# Patient Record
Sex: Female | Born: 1955
Health system: Southern US, Community
[De-identification: ages and names within clinical notes are randomized; demographics above are authoritative.]

## PROBLEM LIST (undated history)

## (undated) DIAGNOSIS — I10 Essential (primary) hypertension: Secondary | ICD-10-CM

## (undated) HISTORY — DX: Essential (primary) hypertension: I10

---

## 2000-01-01 ENCOUNTER — Encounter: Payer: Self-pay | Admitting: Obstetrics and Gynecology

## 2000-01-01 ENCOUNTER — Ambulatory Visit (HOSPITAL_COMMUNITY): Admission: RE | Admit: 2000-01-01 | Discharge: 2000-01-01 | Payer: Self-pay | Admitting: Obstetrics and Gynecology

## 2000-01-17 ENCOUNTER — Ambulatory Visit (HOSPITAL_COMMUNITY): Admission: RE | Admit: 2000-01-17 | Discharge: 2000-01-17 | Payer: Self-pay | Admitting: General Surgery

## 2000-01-17 ENCOUNTER — Encounter: Payer: Self-pay | Admitting: General Surgery

## 2000-01-19 ENCOUNTER — Encounter: Payer: Self-pay | Admitting: General Surgery

## 2000-01-25 ENCOUNTER — Inpatient Hospital Stay (HOSPITAL_COMMUNITY): Admission: RE | Admit: 2000-01-25 | Discharge: 2000-01-30 | Payer: Self-pay | Admitting: General Surgery

## 2011-05-31 ENCOUNTER — Other Ambulatory Visit: Payer: Self-pay | Admitting: Family Medicine

## 2011-05-31 DIAGNOSIS — Z1231 Encounter for screening mammogram for malignant neoplasm of breast: Secondary | ICD-10-CM

## 2011-06-09 ENCOUNTER — Ambulatory Visit: Payer: Self-pay

## 2011-06-09 ENCOUNTER — Ambulatory Visit
Admission: RE | Admit: 2011-06-09 | Discharge: 2011-06-09 | Disposition: A | Discharge status: Home / Self Care | Payer: BC Managed Care – PPO | Source: Ambulatory Visit | Attending: Family Medicine | Admitting: Family Medicine

## 2011-06-09 DIAGNOSIS — Z1231 Encounter for screening mammogram for malignant neoplasm of breast: Secondary | ICD-10-CM

## 2012-12-03 ENCOUNTER — Other Ambulatory Visit: Payer: Self-pay | Admitting: Physician Assistant

## 2012-12-03 DIAGNOSIS — Z1231 Encounter for screening mammogram for malignant neoplasm of breast: Secondary | ICD-10-CM

## 2012-12-17 ENCOUNTER — Ambulatory Visit: Payer: BC Managed Care – PPO

## 2013-01-04 ENCOUNTER — Ambulatory Visit
Admission: RE | Admit: 2013-01-04 | Discharge: 2013-01-04 | Disposition: A | Discharge status: Home / Self Care | Payer: BC Managed Care – PPO | Source: Ambulatory Visit | Attending: Physician Assistant | Admitting: Physician Assistant

## 2013-01-04 DIAGNOSIS — Z1231 Encounter for screening mammogram for malignant neoplasm of breast: Secondary | ICD-10-CM

## 2017-02-08 ENCOUNTER — Other Ambulatory Visit: Payer: Self-pay | Admitting: Physician Assistant

## 2017-02-08 DIAGNOSIS — Z1231 Encounter for screening mammogram for malignant neoplasm of breast: Secondary | ICD-10-CM

## 2018-02-08 ENCOUNTER — Other Ambulatory Visit: Payer: Self-pay | Admitting: Physician Assistant

## 2018-02-08 DIAGNOSIS — Z1231 Encounter for screening mammogram for malignant neoplasm of breast: Secondary | ICD-10-CM

## 2018-02-19 ENCOUNTER — Ambulatory Visit
Admission: RE | Admit: 2018-02-19 | Discharge: 2018-02-19 | Disposition: A | Discharge status: Home / Self Care | Payer: 59 | Source: Ambulatory Visit | Attending: Physician Assistant | Admitting: Physician Assistant

## 2018-02-19 DIAGNOSIS — Z1231 Encounter for screening mammogram for malignant neoplasm of breast: Secondary | ICD-10-CM

## 2019-05-30 ENCOUNTER — Other Ambulatory Visit: Payer: Self-pay | Admitting: Physician Assistant

## 2019-05-30 DIAGNOSIS — Z1231 Encounter for screening mammogram for malignant neoplasm of breast: Secondary | ICD-10-CM

## 2019-06-24 ENCOUNTER — Ambulatory Visit (AMBULATORY_SURGERY_CENTER): Payer: Self-pay | Admitting: *Deleted

## 2019-06-24 ENCOUNTER — Other Ambulatory Visit: Payer: Self-pay

## 2019-06-24 VITALS — Temp 97.1°F | Ht 65.75 in | Wt 183.0 lb

## 2019-06-24 DIAGNOSIS — Z01818 Encounter for other preprocedural examination: Secondary | ICD-10-CM

## 2019-06-24 DIAGNOSIS — Z1211 Encounter for screening for malignant neoplasm of colon: Secondary | ICD-10-CM

## 2019-06-24 MED ORDER — NA SULFATE-K SULFATE-MG SULF 17.5-3.13-1.6 GM/177ML PO SOLN: ORAL | 0 refills | Status: DC

## 2019-06-24 NOTE — Progress Notes (Signed)
Patient is here in-person for PV. Patient denies any allergies to eggs or soy. Patient denies any problems with anesthesia/sedation. Patient denies any oxygen use at home. Patient denies taking any  blood thinners.patient is taking Phentermine and she is aware to stop this 10 days prior to exam.  Patient is not being treated for MRSA or C-diff. EMMI education assisgned to the patient for the procedure, this was explained and instructions given to patient. COVID-19 screening test is on 3/10, the pt is aware. Pt is aware that care partner will wait in the car during procedure; if they feel like they will be too hot or cold to wait in the car; they may wait in the 4 th floor lobby. Patient is aware to bring only one care partner. We want them to wear a mask (we do not have any that we can provide them), practice social distancing, and we will check their temperatures when they get here.  I did remind the patient that their care partner needs to stay in the parking lot the entire time and have a cell phone available, we will call them when the pt is ready for discharge. Patient will wear mask into building.    Suprep $15 off coupon given to the patient.

## 2019-07-03 ENCOUNTER — Ambulatory Visit (INDEPENDENT_AMBULATORY_CARE_PROVIDER_SITE_OTHER): Payer: 59

## 2019-07-03 ENCOUNTER — Other Ambulatory Visit: Payer: Self-pay | Admitting: Gastroenterology

## 2019-07-03 ENCOUNTER — Encounter: Payer: Self-pay | Admitting: Gastroenterology

## 2019-07-03 DIAGNOSIS — Z1159 Encounter for screening for other viral diseases: Secondary | ICD-10-CM

## 2019-07-04 LAB — SARS CORONAVIRUS 2 (TAT 6-24 HRS): SARS Coronavirus 2: NEGATIVE

## 2019-07-08 ENCOUNTER — Ambulatory Visit (AMBULATORY_SURGERY_CENTER): Payer: 59 | Admitting: Gastroenterology

## 2019-07-08 ENCOUNTER — Other Ambulatory Visit: Payer: Self-pay

## 2019-07-08 ENCOUNTER — Encounter: Payer: Self-pay | Admitting: Gastroenterology

## 2019-07-08 VITALS — BP 119/72 | HR 63 | Temp 97.5°F | Resp 16 | Ht 65.75 in | Wt 183.0 lb

## 2019-07-08 DIAGNOSIS — Z1211 Encounter for screening for malignant neoplasm of colon: Secondary | ICD-10-CM

## 2019-07-08 MED ORDER — SODIUM CHLORIDE 0.9 % IV SOLN: 500.0000 mL | Freq: Once | INTRAVENOUS | Status: DC

## 2019-07-08 NOTE — Op Note (Signed)
[facility] Patient Name: Sandy Blackburn Procedure Date: 07/08/2019 8:40 AM MRN: [id number] Endoscopist: Sherilyn Cooter L. Myrtie Neither , MD Age: 64 Referring MD:  Date of Birth: 03/19/1956 Gender: Female Account #: [id number] Procedure:                Colonoscopy Indications:              Screening for colorectal malignant neoplasm, This                            is the patient's first screening colonoscopy Medicines:                Monitored Anesthesia Care Procedure:                Pre-Anesthesia Assessment:                           - Prior to the procedure, a History and Physical                            was performed, and patient medications and                            allergies were reviewed. The patient's tolerance of                            previous anesthesia was also reviewed. The risks                            and benefits of the procedure and the sedation                            options and risks were discussed with the patient.                            All questions were answered, and informed consent                            was obtained. Prior Anticoagulants: The patient has                            taken no previous anticoagulant or antiplatelet                            agents. ASA Grade Assessment: II - A patient with                            mild systemic disease. After reviewing the risks                            and benefits, the patient was deemed in                            satisfactory condition to undergo the procedure.  After obtaining informed consent, the colonoscope                            was passed under direct vision. Throughout the                            procedure, the patient's blood pressure, pulse, and                            oxygen saturations were monitored continuously. The                            Colonoscope was introduced through the anus and                            advanced to the the  cecum, identified by                            appendiceal orifice and ileocecal valve. The                            colonoscopy was performed without difficulty. The                            patient tolerated the procedure well. The quality                            of the bowel preparation was excellent. The                            ileocecal valve, appendiceal orifice, and rectum                            were photographed. Scope In: 8:46:04 AM Scope Out: 8:59:31 AM Scope Withdrawal Time: 0 hours 9 minutes 53 seconds  Total Procedure Duration: 0 hours 13 minutes 27 seconds  Findings:                 The perianal and digital rectal examinations were                            normal.                           Multiple diverticula were found in the left colon                            and right colon.                           The exam was otherwise without abnormality on                            direct and retroflexion views. Complications:            No immediate complications. Estimated Blood Loss:  Estimated blood loss: none. Impression:               - Diverticulosis in the left colon and in the right                            colon.                           - The examination was otherwise normal on direct                            and retroflexion views.                           - No specimens collected. Recommendation:           - Patient has a contact number available for                            emergencies. The signs and symptoms of potential                            delayed complications were discussed with the                            patient. Return to normal activities tomorrow.                            Written discharge instructions were provided to the                            patient.                           - Resume previous diet.                           - Continue present medications.                           - Repeat colonoscopy  in 10 years for screening                            purposes. [name], MD 07/08/2019 9:03:26 AM This report has been signed electronically.

## 2019-07-08 NOTE — Patient Instructions (Signed)
Please read handouts provided. Continue present medications.   YOU HAD AN ENDOSCOPIC PROCEDURE TODAY AT THE [facility]:   Refer to the procedure report that was given to you for any specific questions about what was found during the examination.  If the procedure report does not answer your questions, please call your gastroenterologist to clarify.  If you requested that your care partner not be given the details of your procedure findings, then the procedure report has been included in a sealed envelope for you to review at your convenience later.  YOU SHOULD EXPECT: Some feelings of bloating in the abdomen. Passage of more gas than usual.  Walking can help get rid of the air that was put into your GI tract during the procedure and reduce the bloating. If you had a lower endoscopy (such as a colonoscopy or flexible sigmoidoscopy) you may notice spotting of blood in your stool or on the toilet paper. If you underwent a bowel prep for your procedure, you may not have a normal bowel movement for a few days.  Please Note:  You might notice some irritation and congestion in your nose or some drainage.  This is from the oxygen used during your procedure.  There is no need for concern and it should clear up in a day or so.  SYMPTOMS TO REPORT IMMEDIATELY:  Following lower endoscopy (colonoscopy or flexible sigmoidoscopy):  Excessive amounts of blood in the stool  Significant tenderness or worsening of abdominal pains  Swelling of the abdomen that is new, acute  Fever of 100F or higher   For urgent or emergent issues, a gastroenterologist can be reached at any hour by calling [phone]. Do not use MyChart messaging for urgent concerns.    DIET:  We do recommend a small meal at first, but then you may proceed to your regular diet.  Drink plenty of fluids but you should avoid alcoholic beverages for 24 hours.  ACTIVITY:  You should plan to take it easy for the rest of today and  you should NOT DRIVE or use heavy machinery until tomorrow (because of the sedation medicines used during the test).    FOLLOW UP: Our staff will call the number listed on your records 48-72 hours following your procedure to check on you and address any questions or concerns that you may have regarding the information given to you following your procedure. If we do not reach you, we will leave a message.  We will attempt to reach you two times.  During this call, we will ask if you have developed any symptoms of COVID 19. If you develop any symptoms (ie: fever, flu-like symptoms, shortness of breath, cough etc.) before then, please call [phone].  If you test positive for Covid 19 in the 2 weeks post procedure, please call and report this information to us.    If any biopsies were taken you will be contacted by phone or by letter within the next 1-3 weeks.  Please call us at [phone] if you have not heard about the biopsies in 3 weeks.    SIGNATURES/CONFIDENTIALITY: You and/or your care partner have signed paperwork which will be entered into your electronic medical record.  These signatures attest to the fact that that the information above on your After Visit Summary has been reviewed and is understood.  Full responsibility of the confidentiality of this discharge information lies with you and/or your care-partner.  

## 2019-07-08 NOTE — Progress Notes (Signed)
Pt's states no medical or surgical changes since previsit or office visit.  Temp JB VS DT  

## 2019-07-08 NOTE — Progress Notes (Signed)
To PACU, VSS. Report to [email or web address] 

## 2019-07-10 ENCOUNTER — Telehealth: Payer: Self-pay

## 2019-07-10 NOTE — Telephone Encounter (Signed)
First post procedure follow up call, no answer 

## 2019-07-10 NOTE — Telephone Encounter (Signed)
  Follow up Call-  Call back number 07/08/2019  Post procedure Call Back phone  # [phone]  Permission to leave phone message Yes  Some recent data might be hidden     Patient questions:  Do you have a fever, pain , or abdominal swelling? No. Pain Score  0 *  Have you tolerated food without any problems? Yes.    Have you been able to return to your normal activities? Yes.    Do you have any questions about your discharge instructions: Diet   No. Medications  No. Follow up visit  No.  Do you have questions or concerns about your Care? No.  Actions: * If pain score is 4 or above: No action needed, pain <4.  1. Have you developed a fever since your procedure? no  2.   Have you had an respiratory symptoms (SOB or cough) since your procedure? no  3.   Have you tested positive for COVID 19 since your procedure no  4.   Have you had any family members/close contacts diagnosed with the COVID 19 since your procedure?  no   If yes to any of these questions please route to Laverna Peace, RN and Jennye Boroughs, Charity fundraiser.

## 2019-07-23 ENCOUNTER — Ambulatory Visit: Payer: 59

## 2019-09-10 ENCOUNTER — Other Ambulatory Visit: Payer: Self-pay

## 2019-09-10 ENCOUNTER — Ambulatory Visit
Admission: RE | Admit: 2019-09-10 | Discharge: 2019-09-10 | Disposition: A | Discharge status: Home / Self Care | Payer: 59 | Source: Ambulatory Visit | Attending: Physician Assistant | Admitting: Physician Assistant

## 2019-09-10 DIAGNOSIS — Z1231 Encounter for screening mammogram for malignant neoplasm of breast: Secondary | ICD-10-CM

## 2021-03-03 IMAGING — MG DIGITAL SCREENING BILAT W/ TOMO W/ CAD
8 series · 8 of 24 positions shown · non-contrast
Comparison: Previous exam(s).

CLINICAL DATA: Screening.

EXAM:
DIGITAL SCREENING BILATERAL MAMMOGRAM WITH TOMO AND CAD

[R MLO synth-2D]
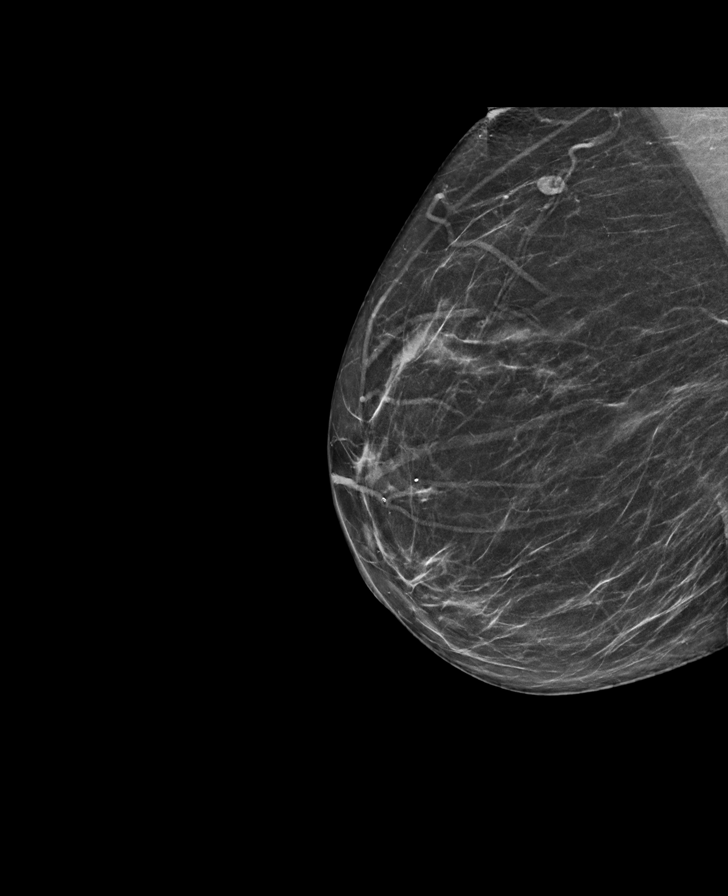

[L MLO synth-2D]
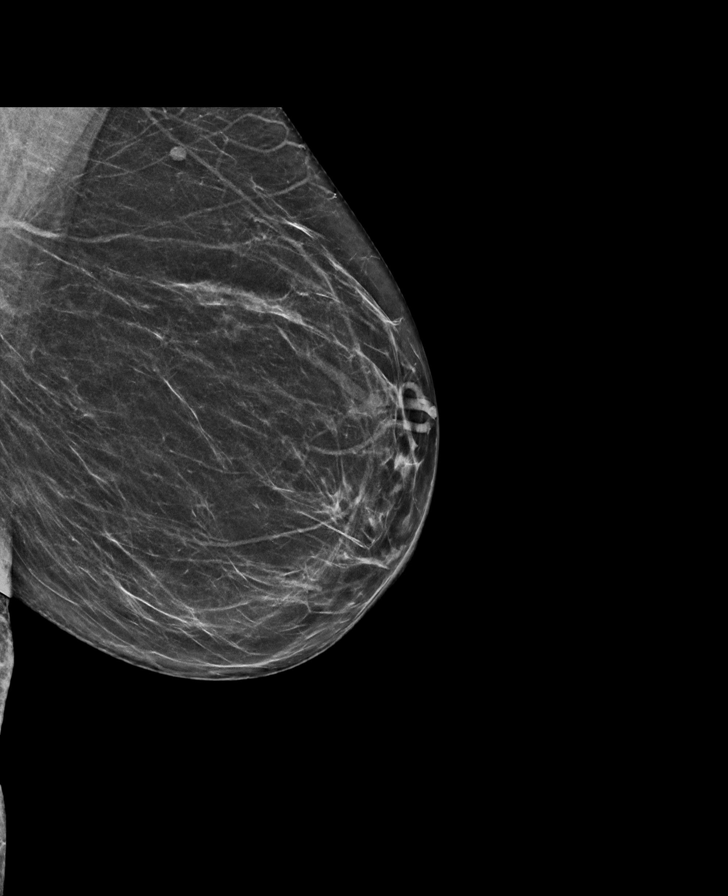

[L CC synth-2D]
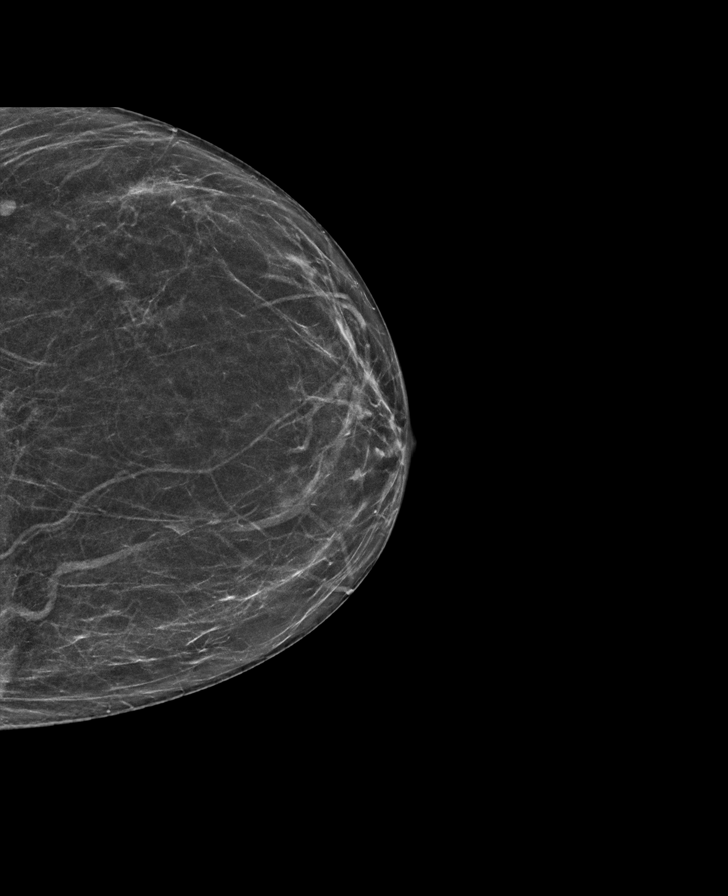

[R CC synth-2D]
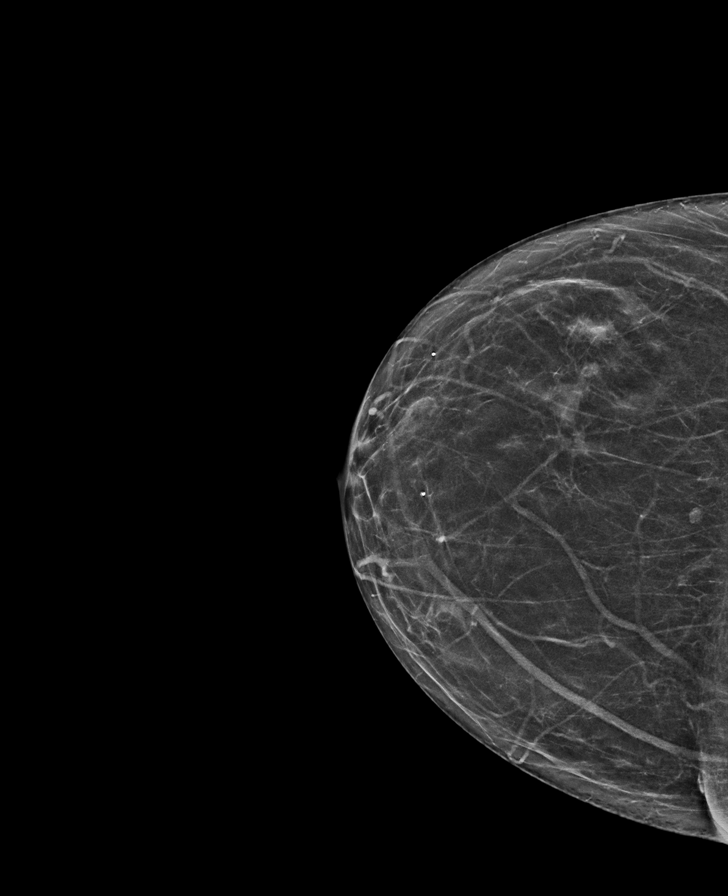

[R CC tomo · tomo slice 30/59.0]
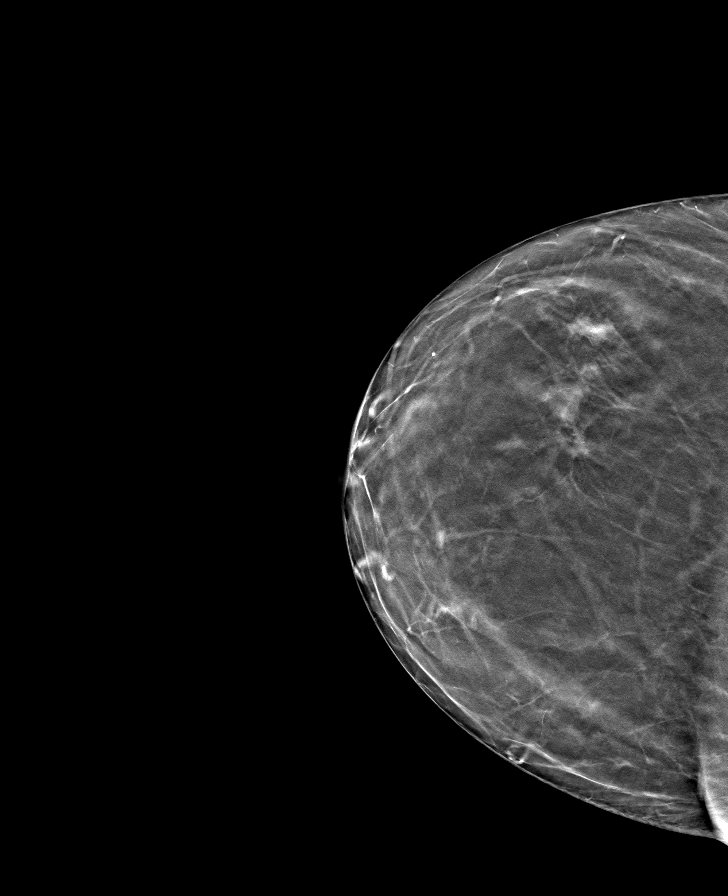

[R MLO tomo · tomo slice 33/65.0]
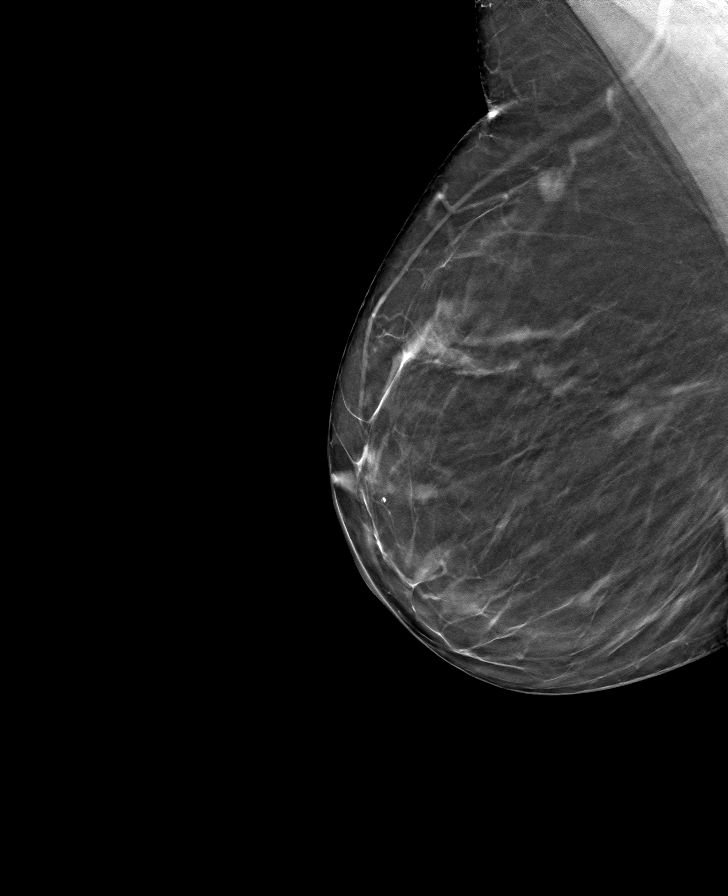

[L MLO tomo · tomo slice 33/64.0]
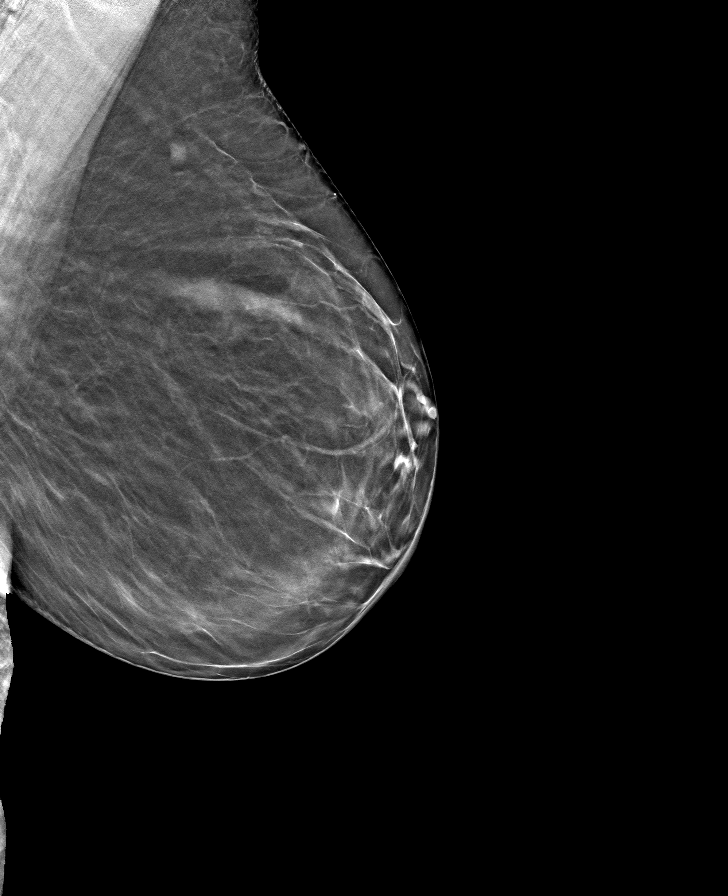

[L CC tomo · tomo slice 29/58.0]
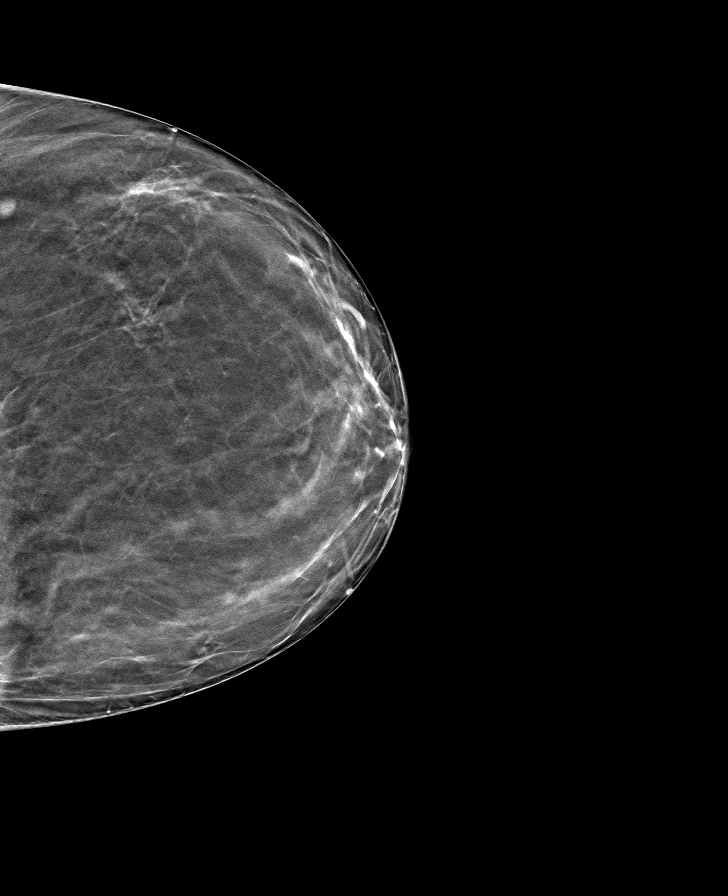

[8 of 24 positions shown; findings below may reference images not displayed]

ACR Breast Density Category b: There are scattered areas of
fibroglandular density.
FINDINGS: There are no findings suspicious for malignancy. Images were
processed with CAD.
IMPRESSION: No mammographic evidence of malignancy. A result letter of this
screening mammogram will be mailed directly to the patient.

RECOMMENDATION:
Screening mammogram in one year. (Code:[id number])

BI-RADS CATEGORY  1: Negative.

## 2023-07-19 ENCOUNTER — Other Ambulatory Visit: Payer: Self-pay | Admitting: Physician Assistant

## 2023-07-19 DIAGNOSIS — Z1231 Encounter for screening mammogram for malignant neoplasm of breast: Secondary | ICD-10-CM

## 2023-08-09 ENCOUNTER — Ambulatory Visit
Admission: RE | Admit: 2023-08-09 | Discharge: 2023-08-09 | Disposition: A | Discharge status: Home / Self Care | Source: Ambulatory Visit | Attending: Physician Assistant | Admitting: Physician Assistant

## 2023-08-09 DIAGNOSIS — Z1231 Encounter for screening mammogram for malignant neoplasm of breast: Secondary | ICD-10-CM
